# Patient Record
Sex: Female | Born: 1935 | Race: White | Hispanic: No | Marital: Married
Health system: Southern US, Community
[De-identification: ages and names within clinical notes are randomized; demographics above are authoritative.]

---

## 2004-10-14 ENCOUNTER — Ambulatory Visit: Payer: Self-pay | Admitting: Family Medicine

## 2005-10-18 ENCOUNTER — Ambulatory Visit: Payer: Self-pay | Admitting: Family Medicine

## 2006-10-24 ENCOUNTER — Ambulatory Visit: Payer: Self-pay | Admitting: Family Medicine

## 2007-10-25 ENCOUNTER — Ambulatory Visit: Payer: Self-pay | Admitting: Family Medicine

## 2007-10-27 ENCOUNTER — Ambulatory Visit: Payer: Self-pay | Admitting: Family Medicine

## 2009-07-22 ENCOUNTER — Ambulatory Visit: Payer: Self-pay | Admitting: Family Medicine

## 2009-10-08 ENCOUNTER — Ambulatory Visit: Payer: Self-pay | Admitting: Family Medicine

## 2012-10-31 ENCOUNTER — Ambulatory Visit: Payer: Self-pay | Admitting: Family Medicine

## 2013-10-08 ENCOUNTER — Emergency Department: Payer: Self-pay | Admitting: Internal Medicine

## 2013-10-08 LAB — COMPREHENSIVE METABOLIC PANEL
ANION GAP: 5 — AB (ref 7–16)
Albumin: 3.9 g/dL (ref 3.4–5.0)
Alkaline Phosphatase: 75 U/L
BILIRUBIN TOTAL: 0.2 mg/dL (ref 0.2–1.0)
BUN: 14 mg/dL (ref 7–18)
CHLORIDE: 106 mmol/L (ref 98–107)
CO2: 29 mmol/L (ref 21–32)
Calcium, Total: 8.5 mg/dL (ref 8.5–10.1)
Creatinine: 0.73 mg/dL (ref 0.60–1.30)
Glucose: 102 mg/dL — ABNORMAL HIGH (ref 65–99)
Osmolality: 280 (ref 275–301)
POTASSIUM: 4.1 mmol/L (ref 3.5–5.1)
SGOT(AST): 18 U/L (ref 15–37)
SGPT (ALT): 23 U/L
Sodium: 140 mmol/L (ref 136–145)
TOTAL PROTEIN: 7.1 g/dL (ref 6.4–8.2)

## 2013-10-08 LAB — URINALYSIS, COMPLETE
Bilirubin,UR: NEGATIVE
Blood: NEGATIVE
Glucose,UR: NEGATIVE mg/dL (ref 0–75)
Ketone: NEGATIVE
NITRITE: NEGATIVE
PH: 6 (ref 4.5–8.0)
Protein: NEGATIVE
RBC,UR: 3 /HPF (ref 0–5)
SPECIFIC GRAVITY: 1.01 (ref 1.003–1.030)
Squamous Epithelial: 1
WBC UR: 6 /HPF (ref 0–5)

## 2013-10-08 LAB — CBC
HCT: 32.4 % — ABNORMAL LOW (ref 35.0–47.0)
HGB: 10.2 g/dL — ABNORMAL LOW (ref 12.0–16.0)
MCH: 24 pg — ABNORMAL LOW (ref 26.0–34.0)
MCHC: 31.4 g/dL — ABNORMAL LOW (ref 32.0–36.0)
MCV: 76 fL — ABNORMAL LOW (ref 80–100)
Platelet: 294 10*3/uL (ref 150–440)
RBC: 4.24 10*6/uL (ref 3.80–5.20)
RDW: 16.4 % — ABNORMAL HIGH (ref 11.5–14.5)
WBC: 6 10*3/uL (ref 3.6–11.0)

## 2013-10-08 LAB — TROPONIN I: Troponin-I: <0.02 ng/mL

## 2013-10-09 DIAGNOSIS — R4182 Altered mental status, unspecified: Secondary | ICD-10-CM

## 2014-01-03 DEATH — deceased

## 2014-04-26 NOTE — Consult Note (Signed)
PATIENT NAME:  Brandy Shannon, Brandy Shannon MR#:  [id number] DATE OF BIRTH:  10-07-1932  DATE OF CONSULTATION:  10/09/2013  CONSULTING PHYSICIAN:  Audery AmelJohn T. Clapacs, MD  IDENTIFYING INFORMATION AND REASON FOR CONSULT: This is a 81 year old woman with a history of dementia who was brought to the Emergency Room by family because of behavior problems.   CHIEF COMPLAINT: "I'm just in this place."   HISTORY OF PRESENT ILLNESS: Information obtained from the patient, the chart, and the patient's children who are present in the room. The patient is not able to give much history. She cannot remember where she is or why she is here or how she got here.  She says that her mood mostly feels okay. Denies any suicidal or homicidal ideation. Not aware of any behavior disturbance. The adult children report that this woman has been showing increasing problem behaviors, especially after 5:00 at night. She has been getting increasingly agitated and  un-directable. Yesterday, she got so agitated with her son that she prevented him from going to work because she was so upset. She is getting more and more confused, frightening the children. They report that at times she will say she is going to kill herself  or blow her brains out, which she admits to, but cannot justify. The patient was recently prescribed Xanax by her primary care doctor which they think made things worse. In the past, she had been taking Risperdal which helped, but she ran out of it, and it was not renewed. No specific known stress other than the progression of her Alzheimer disease.   PAST PSYCHIATRIC HISTORY: Daughter reports that the patient had severe depression for years as a younger woman. No known psychiatric hospitalizations. No known suicide attempts in the past. Unknown what treatment she ever received.   FAMILY HISTORY: Positive for some depression.   SOCIAL HISTORY: The patient lives with her adult children. She is confused about it and does not remember that  that is where she is staying. She frequently gets confused and thinks that her son is actually her deceased husband.   PAST MEDICAL HISTORY: The patient has Hashimoto's (Dictation Anomaly)<<hashimodos thyroiosisMISSING TEXT>> and is on thyroid replacement, chronic arthritis pain and high blood pressure.  No other known ongoing medical problems.   SUBSTANCE ABUSE HISTORY: Does not drink, does not abuse drugs. No past history of alcohol or drug abuse.   REVIEW OF SYSTEMS: The patient denies pain, denies feeling sick, denies feeling nauseated. Admits that she has some memory problems at time. Denies suicidal or homicidal ideation or hallucinations. The rest of the whole 9 category review of systems is negative.   MENTAL STATUS EXAMINATION: Elderly woman, looks her stated age, a bit disheveled, pleasant, however, and cooperative with the interview. Good eye contact. Speech decreased in amount, but easy to understand. Affect is upbeat and giggly, at times somewhat inappropriately. Mood is stated as being fine. Thoughts are very disorganized and slow, clearly marked by severe dementia. She denies suicidal or homicidal ideation. She denies any hallucinations. The patient scores a 7 out of 30 on the Mini-Mental Status Exam, clearly very demented. Judgment and insight poor. Fund of knowledge poor. Not alert or oriented at all.   LABORATORY RESULTS: CBC is all normal, except for slightly low hematocrit at 32.4. Chemistry panel unremarkable. Her urinalysis shows 2+ leukocyte esterase, 6 white blood cells, possible urinary tract infection.   CT of the head shows moderate generalized atrophy and chronic microvascular changes, which has been progressive since  a scan in 2011.   VITAL SIGNS: Blood pressure currently 133/63, respirations 16, pulse 62, temperature 97.6.   ASSESSMENT: A 81 year old woman with advanced dementia who has been showing sundowning and worsening behavior problem at home, making suicidal  statements at times. Verbally aggressive and unmanageable.   TREATMENT PLAN: We will refer her to geriatric psychiatry unit for stabilization and treatment. I have started her on 0.5 mg of Risperdal at night. Continued her usual medicine  including the Aricept 10 mg a day and I have added an Exelon patch.  Psychoeducation and supportive therapy completed.   DIAGNOSIS, PRINCIPAL AND PRIMARY:  AXIS I: Dementia, mixed, probably Alzheimer's and vascular with behavior problems.   SECONDARY DIAGNOSES:  AXIS I:  Known depression in the past.  AXIS II:  (Dictation Anomaly)<<hashimodos thyroidosisMISSING TEXT>>  AXIS III: Chronic high blood pressure, hypothyroidism, with chronic arthritis.    ____________________________ Audery Amel, MD jtc:LT D: 10/09/2013 18:15:16 ET T: 10/09/2013 19:17:13 ET JOB#: [id number]  cc: Audery Amel, MD, <Dictator> Audery Amel MD ELECTRONICALLY SIGNED 10/14/2013 0:22

## 2014-04-26 NOTE — Consult Note (Signed)
Psychiatry: Follow-up for this 81 year old woman with advanced dementia.  On reevaluation she has no new complaints.  Denies depression denies suicidal or homicidal ideation not reporting any hallucinations.  She is not aware of any new physical complaints or side effects from her medicine.  On review of systems she denies pulmonary cardiac GI symptoms also pain symptoms denies mood problems full 9 point review of systems negative. mental status she is smiling and awake and alert.  Memory is clearly very impaired.  Does not remember me at all from yesterday.  Not really oriented to where she is or what she is doing.  Not aggressive or dangerous generally cooperative. Patient appears physically stable.  Vital signs stable.  She has been [facility] ger[facility] unit.  She will be transferred there under involuntary commitment for further stabilization of behavior in the context of her dementia.  Hopefully they will also be able to get her placed in a better living arrangement at the time of discharge.  No further change to medicine.  Continue all medicines as currently prescribed including the Risperdal that was started in the evening and the Exelon patch. dementia due to Alzheimer's and vascular disease moderate to severe with associated behavior problems.  Total time 20 minutes  Electronic Signatures: Clapacs, Jackquline DenmarkJohn T (MD)  (Signed on 08-Oct-15 15:37)  Authored  Last Updated: 08-Oct-15 15:37 by Audery Amellapacs, John T (MD)

## 2016-05-26 IMAGING — CT CT HEAD WITHOUT CONTRAST
2 series · 15 of 30 positions shown, 19 images · non-contrast
Comparison: 07/22/2009.

CLINICAL DATA: Acute mental status changes in patient with current
history of Alzheimer's dementia. Patient's son states that she is
more angry and combative over the past several days.

EXAM:
CT HEAD WITHOUT CONTRAST
TECHNIQUE: Contiguous axial images were obtained from the base of the skull
through the vertex without intravenous contrast.

[Series 2: head wo · axial · 0.41mm/px · z∈[+490,+625]mm · 13 of 36 slices shown, 17 images]
[im 3/36  brain]
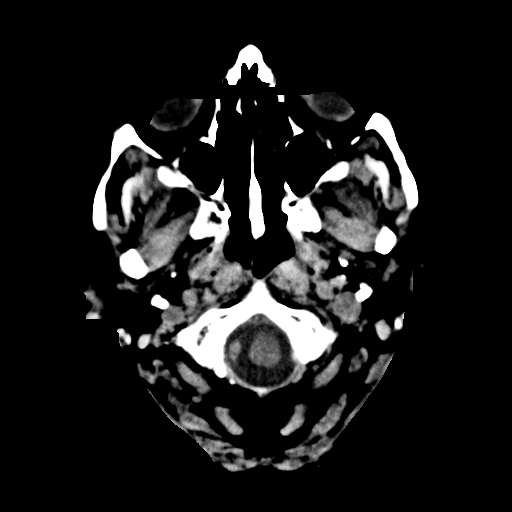
[im 3/36  bone]
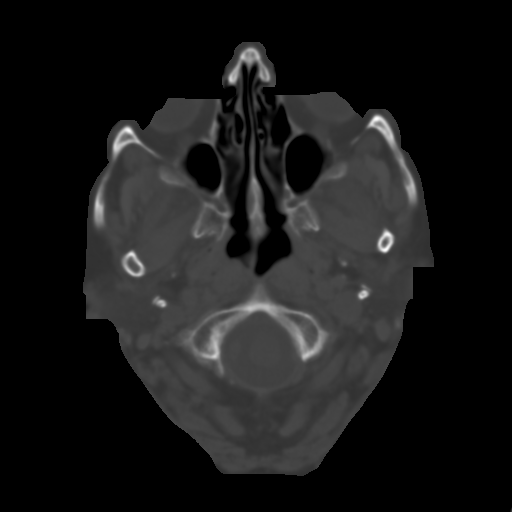
[im 6/36  brain]
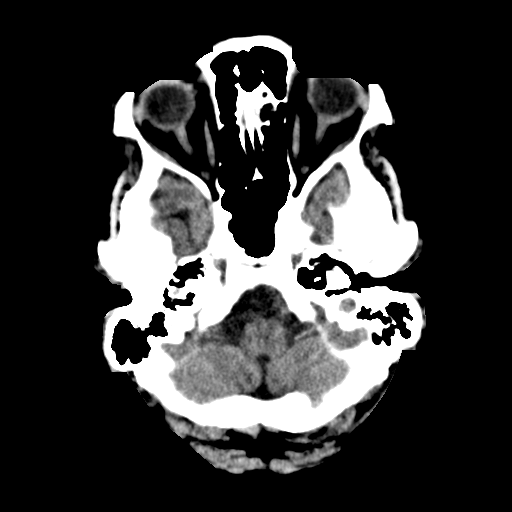
[im 8/36  brain]
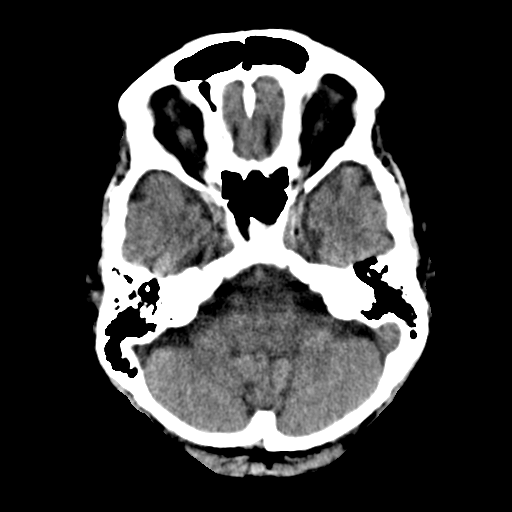
[im 11/36  brain]
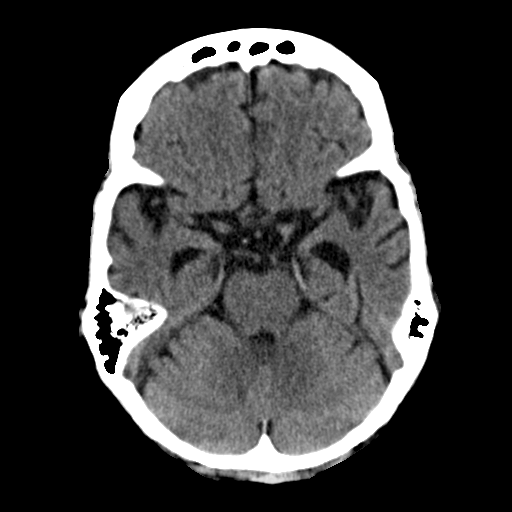
[im 13/36  brain]
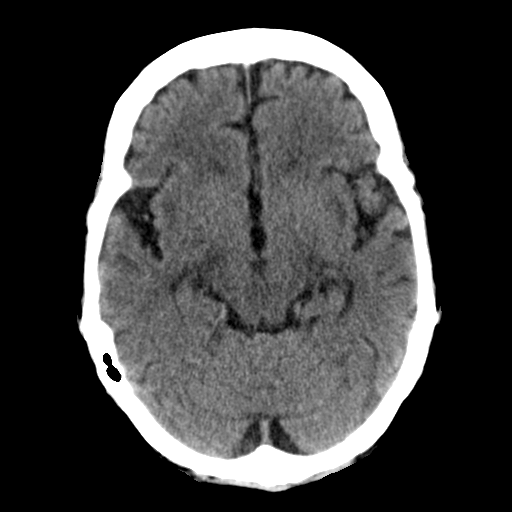
[im 13/36  bone]
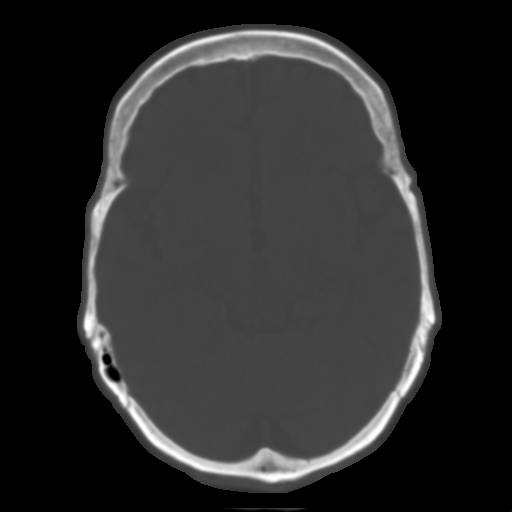
[im 16/36  brain]
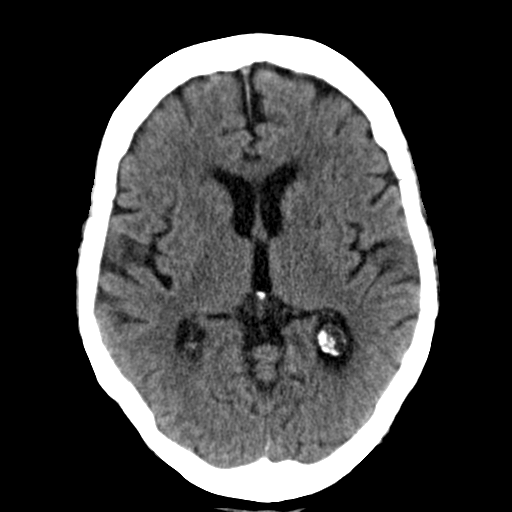
[im 18/36  brain]
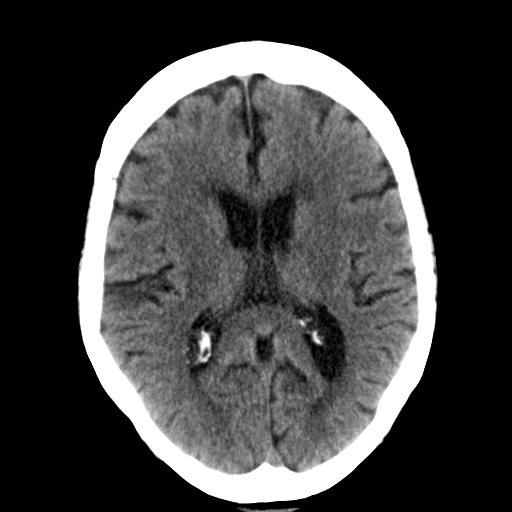
[im 21/36  brain]
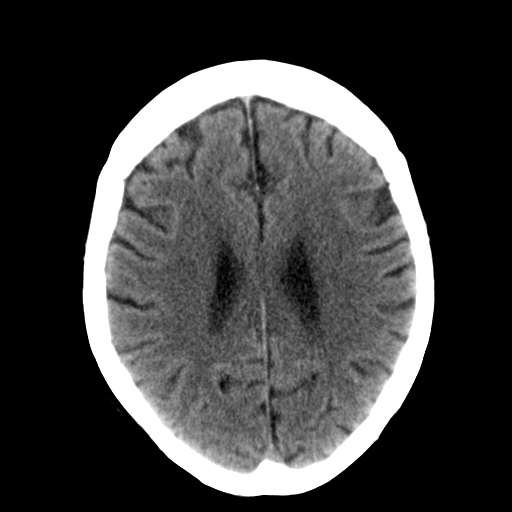
[im 23/36  brain]
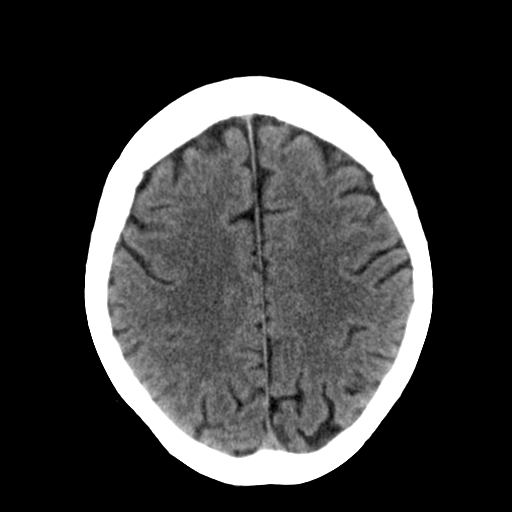
[im 23/36  bone]
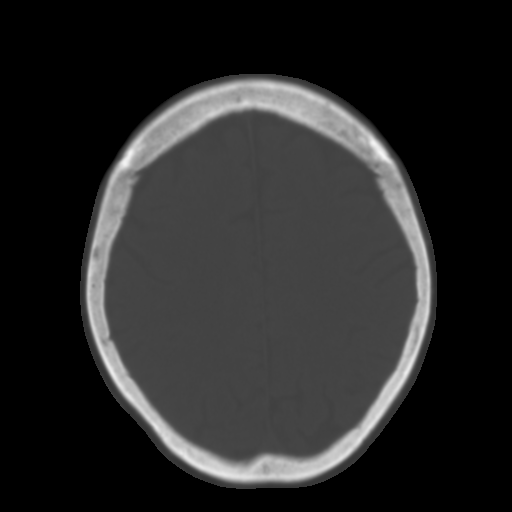
[im 26/36  brain]
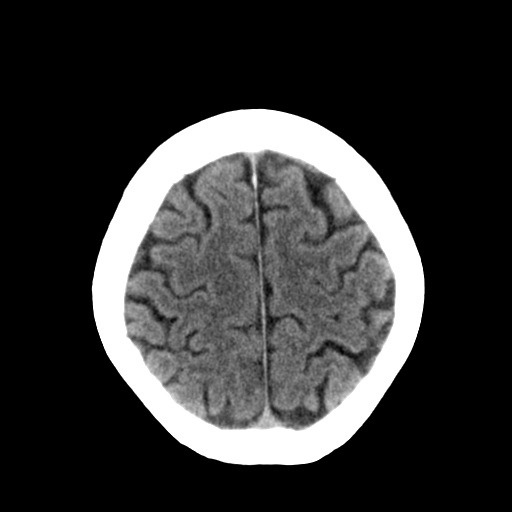
[im 28/36  brain]
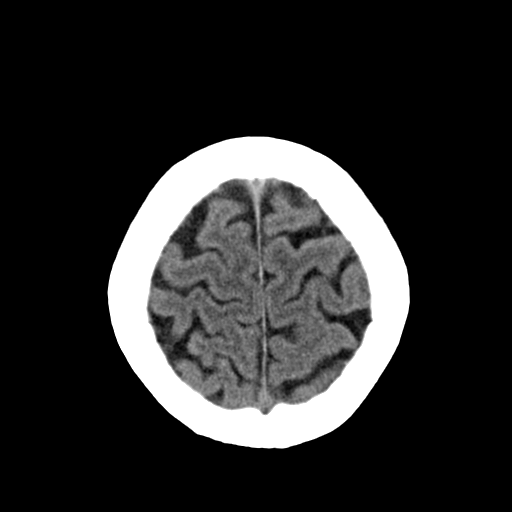
[im 31/36  brain]
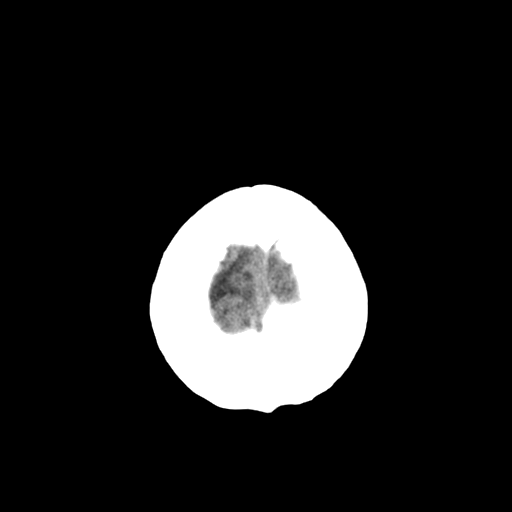
[im 33/36  brain]
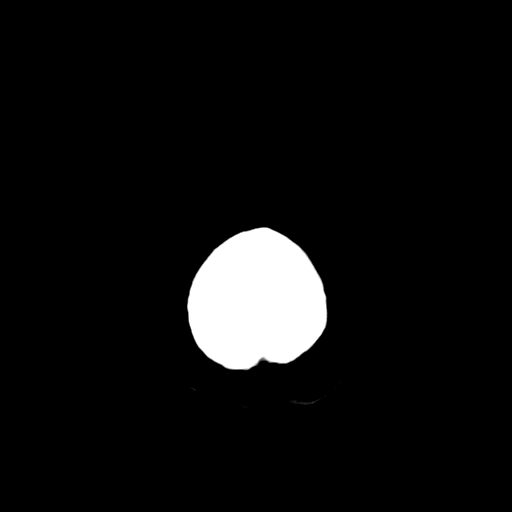
[im 33/36  bone]
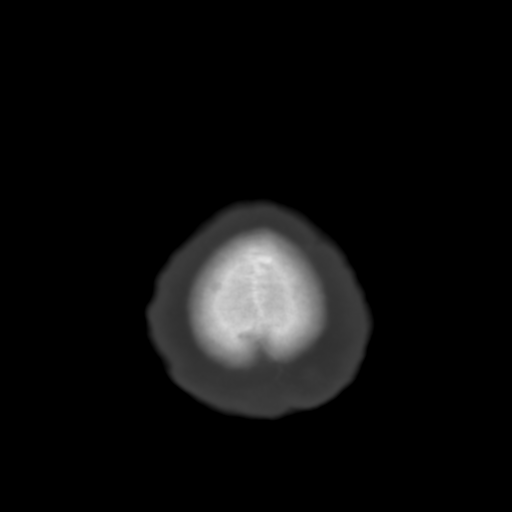

[Series 3: head bone · axial · 0.39mm/px · z∈[+485,+508]mm · 2 of 36 slices shown]
[im 3/36  bone]
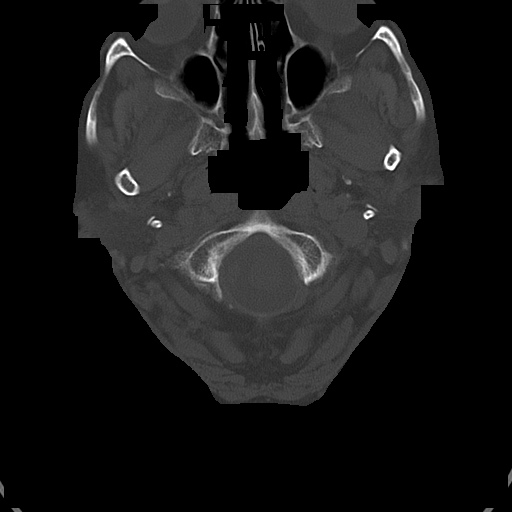
[im 8/36  bone]
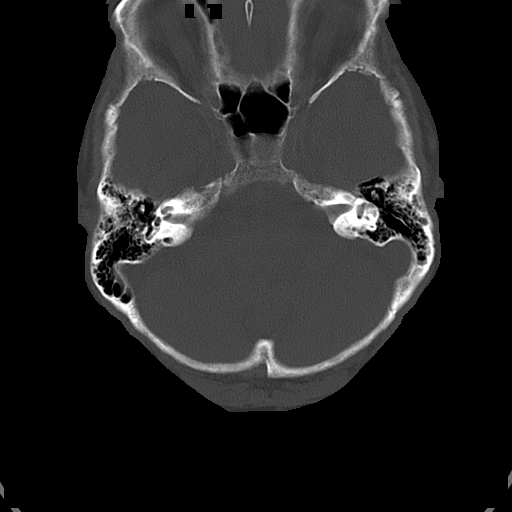

[15 of 30 positions shown; findings below may reference images not displayed]

FINDINGS: Ventricular system normal in size and appearance for age. Moderate
cortical, deep and cerebellar atrophy, progressive since the prior
examination. Mild changes of small vessel disease of the white
matter diffusely, also progressive. No mass lesion. No midline
shift. No acute hemorrhage or hematoma. No extra-axial fluid
collections. No evidence of acute infarction.

No skull fracture or other focal osseous abnormality involving the
skull. Visualized paranasal sinuses, bilateral mastoid air cells and
bilateral middle ear cavities well-aerated. Mild bilateral carotid
siphon atherosclerosis.
IMPRESSION: 1. No acute intracranial abnormality.
2. Moderate generalized atrophy and mild chronic microvascular
ischemic changes of the white matter, progressive since [DATE].

## 2016-05-27 IMAGING — CR DG CHEST 2V
1 series · 2 of 2 positions shown · non-contrast
Comparison: No priors.

CLINICAL DATA: 81-year-old female with altered mental status.
History of Alzheimer's disease. Increasing anger and combative be a
very.

EXAM:
CHEST  2 VIEW

[Series 1: dxr chest pa (or ap) and lateral · 0.14mm/px · 2 of 2 slices shown]
[im 1/2]
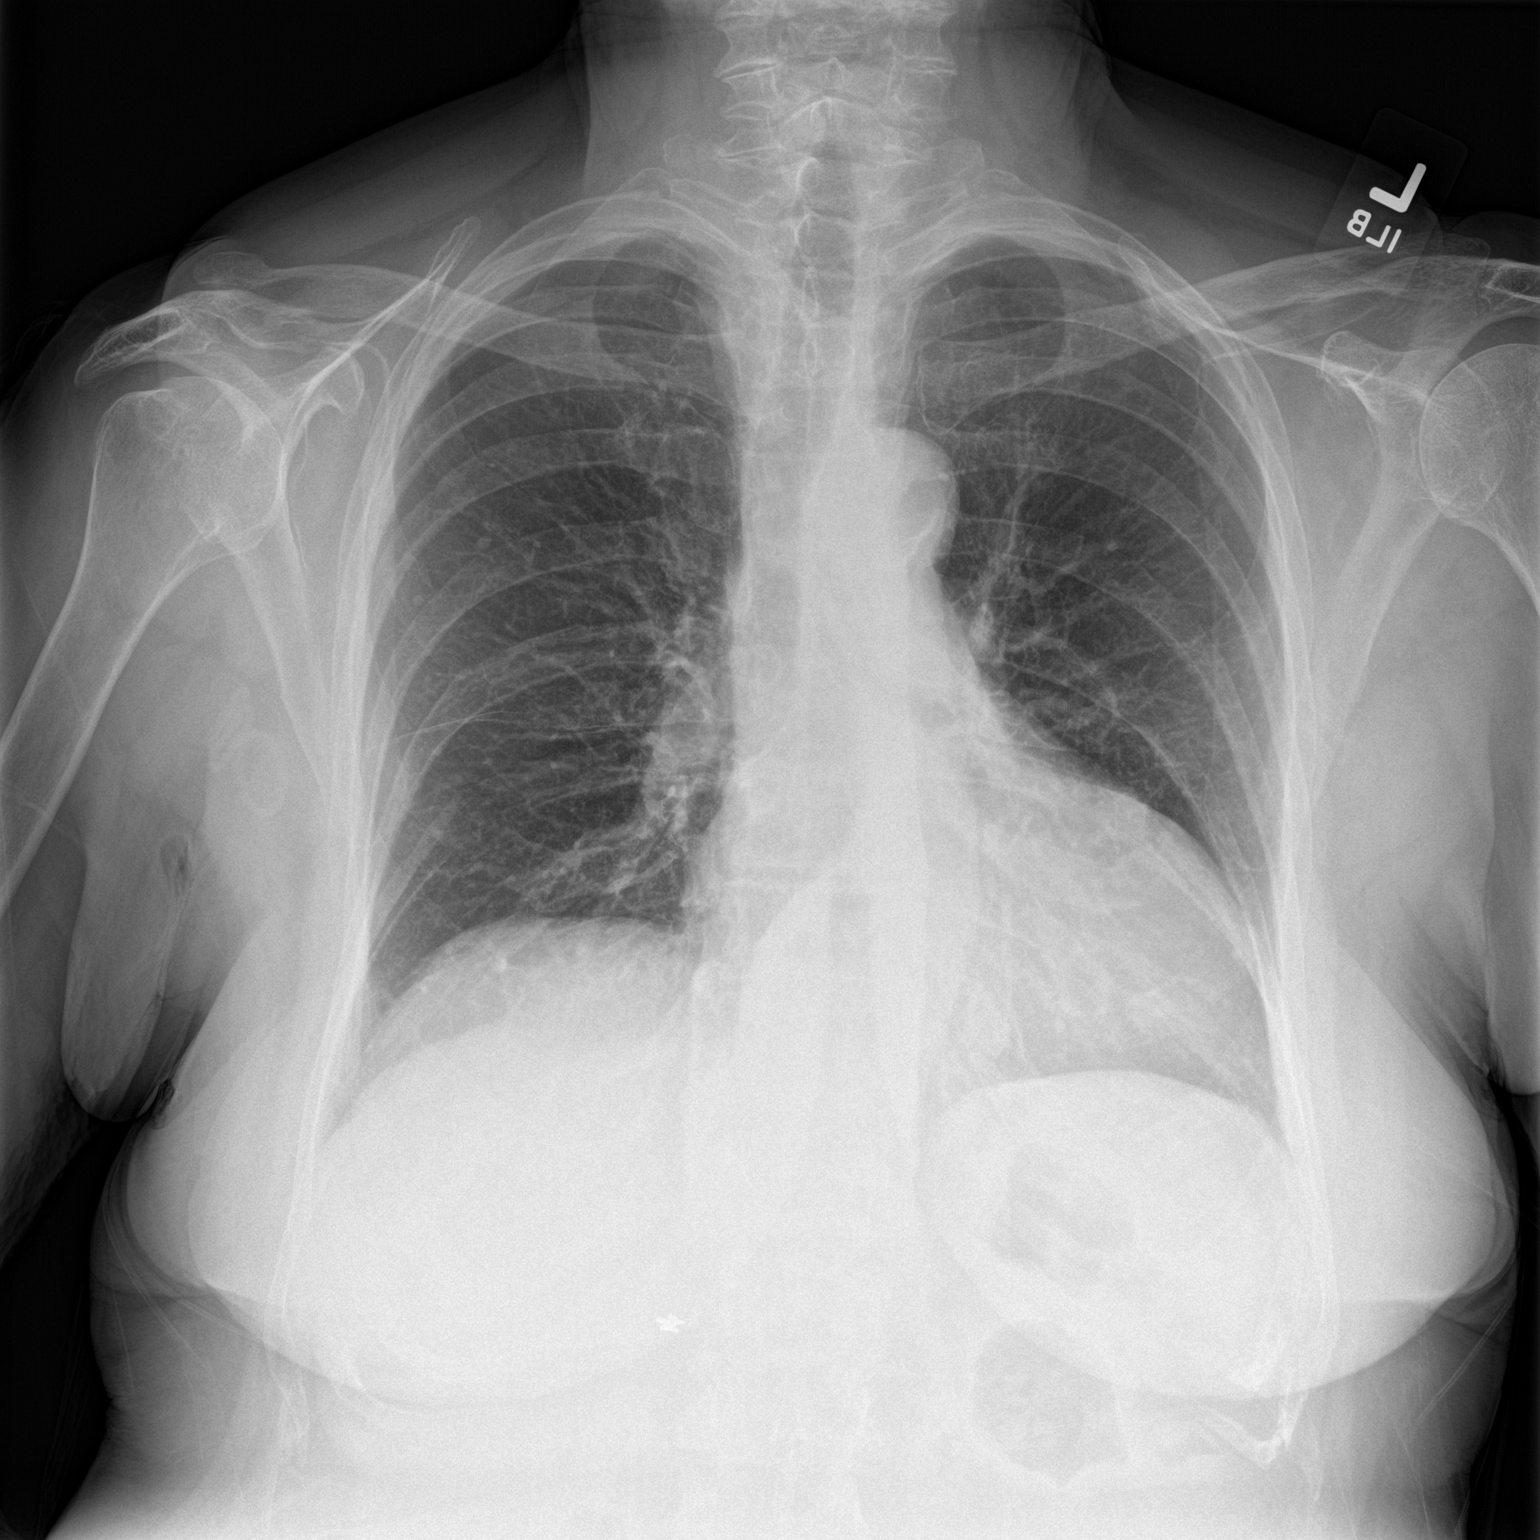
[im 2/2]
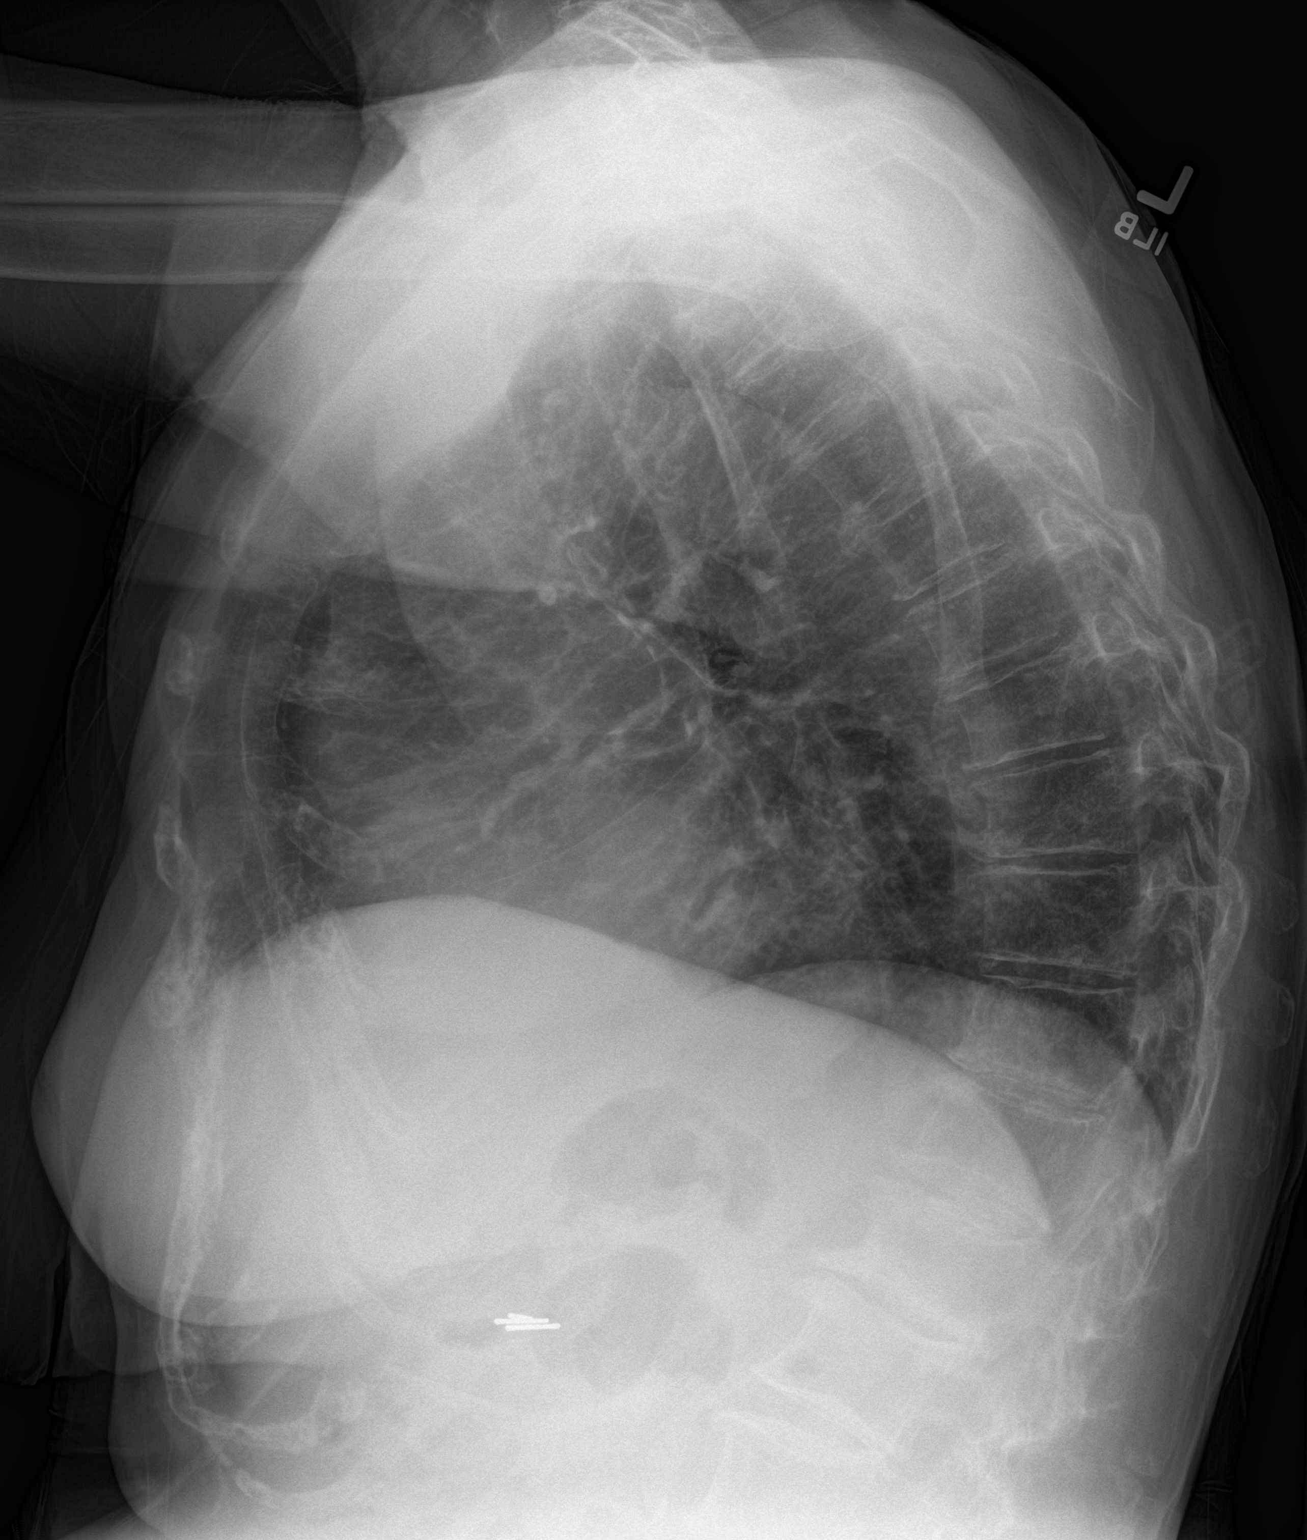

[2 of 2 positions shown; findings below may reference images not displayed]

FINDINGS: A few scattered tiny dense nodules are noted in the lungs
bilaterally, most compatible with calcified granulomas. Lung volumes
are normal. No consolidative airspace disease. No pleural effusions.
No pneumothorax. No pulmonary nodule or mass noted. Pulmonary
vasculature and the cardiomediastinal silhouette are within normal
limits. Atherosclerosis in the thoracic aorta. Surgical clips
project over the right upper quadrant of the abdomen, likely from
prior cholecystectomy.
IMPRESSION: 1. No radiographic evidence of acute cardiopulmonary disease.
2. Atherosclerosis.
